# Patient Record
Sex: Male | Born: 1985 | Hispanic: No | Marital: Single | Smoking: Never smoker
Health system: Southern US, Community
[De-identification: ages and names within clinical notes are randomized; demographics above are authoritative.]

## PROBLEM LIST (undated history)

## (undated) DIAGNOSIS — I10 Essential (primary) hypertension: Secondary | ICD-10-CM

---

## 2016-11-05 ENCOUNTER — Emergency Department (HOSPITAL_COMMUNITY): Payer: 59

## 2016-11-05 ENCOUNTER — Encounter (HOSPITAL_COMMUNITY): Payer: Self-pay | Admitting: Emergency Medicine

## 2016-11-05 ENCOUNTER — Emergency Department (HOSPITAL_COMMUNITY)
Admission: EM | Admit: 2016-11-05 | Discharge: 2016-11-05 | Disposition: A | Discharge status: Home / Self Care | Payer: 59 | Attending: Emergency Medicine | Admitting: Emergency Medicine

## 2016-11-05 DIAGNOSIS — R51 Headache: Secondary | ICD-10-CM | POA: Diagnosis not present

## 2016-11-05 DIAGNOSIS — R0602 Shortness of breath: Secondary | ICD-10-CM | POA: Insufficient documentation

## 2016-11-05 DIAGNOSIS — J189 Pneumonia, unspecified organism: Secondary | ICD-10-CM | POA: Insufficient documentation

## 2016-11-05 DIAGNOSIS — I10 Essential (primary) hypertension: Secondary | ICD-10-CM | POA: Insufficient documentation

## 2016-11-05 HISTORY — DX: Essential (primary) hypertension: I10

## 2016-11-05 LAB — BASIC METABOLIC PANEL
ANION GAP: 10 (ref 5–15)
BUN: 11 mg/dL (ref 6–20)
CHLORIDE: 100 mmol/L — AB (ref 101–111)
CO2: 25 mmol/L (ref 22–32)
CREATININE: 1.03 mg/dL (ref 0.61–1.24)
Calcium: 9.4 mg/dL (ref 8.9–10.3)
GFR calc non Af Amer: >60 mL/min (ref >60)
Glucose, Bld: 98 mg/dL (ref 65–99)
POTASSIUM: 3.7 mmol/L (ref 3.5–5.1)
Sodium: 135 mmol/L (ref 135–145)

## 2016-11-05 LAB — CBC
HEMATOCRIT: 46 % (ref 39.0–52.0)
HEMOGLOBIN: 15.6 g/dL (ref 13.0–17.0)
MCH: 27.4 pg (ref 26.0–34.0)
MCHC: 33.9 g/dL (ref 30.0–36.0)
MCV: 80.7 fL (ref 78.0–100.0)
Platelets: 166 10*3/uL (ref 150–400)
RBC: 5.7 MIL/uL (ref 4.22–5.81)
RDW: 12.4 % (ref 11.5–15.5)
WBC: 8.8 10*3/uL (ref 4.0–10.5)

## 2016-11-05 LAB — URINALYSIS, ROUTINE W REFLEX MICROSCOPIC
Bilirubin Urine: NEGATIVE
Glucose, UA: NEGATIVE mg/dL
Hgb urine dipstick: NEGATIVE
Ketones, ur: NEGATIVE mg/dL
LEUKOCYTES UA: NEGATIVE
NITRITE: NEGATIVE
PROTEIN: NEGATIVE mg/dL
Specific Gravity, Urine: 1.011 (ref 1.005–1.030)
pH: 6.5 (ref 5.0–8.0)

## 2016-11-05 LAB — BRAIN NATRIURETIC PEPTIDE: B NATRIURETIC PEPTIDE 5: 10 pg/mL (ref 0.0–100.0)

## 2016-11-05 LAB — TROPONIN I: Troponin I: <0.03 ng/mL (ref <0.03)

## 2016-11-05 MED ORDER — DIPHENHYDRAMINE HCL 50 MG/ML IJ SOLN
25.0000 mg | Freq: Once | INTRAMUSCULAR | Status: AC
  Administered 2016-11-05: 25 mg via INTRAVENOUS
  Filled 2016-11-05: qty 1

## 2016-11-05 MED ORDER — KETOROLAC TROMETHAMINE 30 MG/ML IJ SOLN
15.0000 mg | Freq: Once | INTRAMUSCULAR | Status: AC
  Administered 2016-11-05: 15 mg via INTRAVENOUS
  Filled 2016-11-05: qty 1

## 2016-11-05 MED ORDER — METOCLOPRAMIDE HCL 5 MG/ML IJ SOLN
10.0000 mg | Freq: Once | INTRAMUSCULAR | Status: AC
Start: 2016-11-05 — End: 2016-11-05
  Administered 2016-11-05: 10 mg via INTRAVENOUS
  Filled 2016-11-05: qty 2

## 2016-11-05 MED ORDER — HYDROCHLOROTHIAZIDE 25 MG PO TABS: 25.0000 mg | ORAL_TABLET | Freq: Every day | ORAL | 0 refills | Status: AC

## 2016-11-05 MED ORDER — BENZONATATE 100 MG PO CAPS: 100.0000 mg | ORAL_CAPSULE | Freq: Three times a day (TID) | ORAL | 0 refills | Status: DC | PRN

## 2016-11-05 MED ORDER — DOXYCYCLINE HYCLATE 100 MG PO CAPS: 100.0000 mg | ORAL_CAPSULE | Freq: Two times a day (BID) | ORAL | 0 refills | Status: AC

## 2016-11-05 NOTE — ED Provider Notes (Signed)
[facility]EMER[facility]ENCY DEPT Provider Note   CSN: [id number] Arrival date & time: 11/05/16  1525     History   Chief Complaint Chief Complaint  Patient presents with  . Hypertension  . Generalized Body Aches    HPI Antonieta LovelessMatthew Prindiville is a 30 y.o. male.  The history is provided by the patient.    30 year old male with past medical history of hypertension who presents with generalized body aches and hypertension. The patient states that over the last week, he has had progressively worsening cough, general fatigue, and diffuse myalgias. He has been taking over-the-counter cough and cold medicine for this. He has a history of hypertension as well. You presented to med clinic today for evaluation and was noted to be hypertensive to the 170s over 110s. He was subsequent sent for evaluation. Currently, the patient endorses generalized body aches but denies fevers. He has had a mild, nonproductive cough. No shortness of breath. Denies any current vision changes, headache, chest pain, or other complaints. He does note a headache at the time that he was at the clinic. Denies any vision changes.  Past Medical History:  Diagnosis Date  . Hypertension     There are no active problems to display for this patient.   History reviewed. No pertinent surgical history.     Home Medications    Prior to Admission medications   Medication Sig Start Date End Date Taking? Authorizing Provider  benzonatate (TESSALON PERLES) 100 MG capsule Take 1 capsule (100 mg total) by mouth 3 (three) times daily as needed for cough. 11/05/16   Shaune Pollackameron Joshalyn Ancheta, MD  doxycycline (VIBRAMYCIN) 100 MG capsule Take 1 capsule (100 mg total) by mouth 2 (two) times daily. 11/05/16 11/12/16  Shaune Pollackameron Adlene Adduci, MD  hydrochlorothiazide (HYDRODIURIL) 25 MG tablet Take 1 tablet (25 mg total) by mouth daily. 11/05/16   Shaune Pollackameron Gabryelle Whitmoyer, MD    Family History No family history on file.  Social History Social History  Substance Use Topics  .  Smoking status: Never Smoker  . Smokeless tobacco: Never Used  . Alcohol use Yes     Comment: 1-2 drinks per week     Allergies   Patient has no known allergies.   Review of Systems Review of Systems  Constitutional: Positive for fatigue. Negative for chills and fever.  HENT: Negative for congestion and rhinorrhea.   Eyes: Negative for visual disturbance.  Respiratory: Negative for cough, shortness of breath and wheezing.   Cardiovascular: Negative for chest pain and leg swelling.  Gastrointestinal: Negative for abdominal pain, diarrhea, nausea and vomiting.  Genitourinary: Negative for dysuria and flank pain.  Musculoskeletal: Positive for arthralgias and myalgias. Negative for neck pain and neck stiffness.  Skin: Negative for rash and wound.  Allergic/Immunologic: Negative for immunocompromised state.  Neurological: Positive for headaches. Negative for syncope and weakness.  All other systems reviewed and are negative.    Physical Exam Updated Vital Signs BP 145/97   Pulse 77   Temp 98.8 F (37.1 C) (Oral)   Resp 13   Ht 5\' 10"  (1.778 m)   Wt 170 lb (77.1 kg)   SpO2 99%   BMI 24.39 kg/m   Physical Exam  Constitutional: He is oriented to person, place, and time. He appears well-developed and well-nourished. No distress.  HENT:  Head: Normocephalic and atraumatic.  Mouth/Throat: Oropharynx is clear and moist. No oropharyngeal exudate.  Eyes: Conjunctivae are normal.  Neck: Normal range of motion. Neck supple.  Cardiovascular: Normal rate, regular rhythm and normal  heart sounds.  Exam reveals no friction rub.   No murmur heard. Pulmonary/Chest: Effort normal and breath sounds normal. No respiratory distress. He has no wheezes. He has no rales.  Abdominal: Soft. Bowel sounds are normal. He exhibits no distension. There is no tenderness. There is no guarding.  Musculoskeletal: He exhibits no edema.  Neurological: He is alert and oriented to person, place, and time.  He exhibits normal muscle tone.  Skin: Skin is warm. Capillary refill takes less than 2 seconds.  Psychiatric: He has a normal mood and affect.  Nursing note and vitals reviewed.    ED Treatments / Results  Labs (all labs ordered are listed, but only abnormal results are displayed) Labs Reviewed  BASIC METABOLIC PANEL - Abnormal; Notable for the following:       Result Value   Chloride 100 (*)    All other components within normal limits  CBC  TROPONIN I  BRAIN NATRIURETIC PEPTIDE  URINALYSIS, ROUTINE W REFLEX MICROSCOPIC (NOT AT [facility])    EKG  EKG Interpretation  Date/Time:  Friday November 05 2016 16:07:37 EST Ventricular Rate:  83 PR Interval:    QRS Duration: 95 QT Interval:  342 QTC Calculation: 402 R Axis:   85 Text Interpretation:  Sinus rhythm Probable left atrial enlargement Baseline wander in lead(s) I III aVL No old tracing to compare Confirmed by Margaretha Mahan MD, Patrick Salemi [phone]) on 11/06/2016 12:51:38 AM       Radiology Dg Chest 2 View  Result Date: 11/05/2016 CLINICAL DATA:  Hypertension, evaluate for possible congestive heart failure. EXAM: CHEST  2 VIEW COMPARISON:  None in PACs FINDINGS: The lungs are mildly hyperinflated. There is no focal infiltrate. The interstitial markings are coarse especially in the left upper lobe. There is mild fullness of the left hilar region. There is no pleural effusion or pneumothorax. The heart and pulmonary vascularity are normal. The bony thorax is unremarkable. IMPRESSION: Hyperinflation consistent with reactive airway disease. Increased interstitial density in the left upper lobe and prominence of the left hilar regions may reflect interstitial pneumonia and possible reactive lymphadenopathy. A follow-up chest x-ray in 2-3 weights following anticipated antibiotic therapy is recommended to assure clearing and exclude underlying malignancy. No CHF Electronically Signed   By: David  [place] M.D.   On: 11/05/2016 16:23   Ct Head Wo  Contrast  Result Date: 11/05/2016 CLINICAL DATA:  Posterior headache, hypertension EXAM: CT HEAD WITHOUT CONTRAST TECHNIQUE: Contiguous axial images were obtained from the base of the skull through the vertex without intravenous contrast. COMPARISON:  None. FINDINGS: Brain: No evidence of acute infarction, hemorrhage, hydrocephalus, extra-axial collection or mass lesion/mass effect. Vascular: No hyperdense vessel or unexpected calcification. Skull: Normal. Negative for fracture or focal lesion. Sinuses/Orbits: Moderate mucosal thickening in the ethmoid sinuses. Orbits appear intact. Other: None IMPRESSION: No CT evidence for acute intracranial abnormality. Electronically Signed   By: Jasmine Pang M.D.   On: 11/05/2016 16:38    Procedures Procedures (including critical care time)  Medications Ordered in ED Medications  metoCLOPramide (REGLAN) injection 10 mg (10 mg Intravenous Given 11/05/16 1729)  diphenhydrAMINE (BENADRYL) injection 25 mg (25 mg Intravenous Given 11/05/16 1729)  ketorolac (TORADOL) 30 MG/ML injection 15 mg (15 mg Intravenous Given 11/05/16 1729)     Initial Impression / Assessment and Plan / ED Course  I have reviewed the triage vital signs and the nursing notes.  Pertinent labs & imaging results that were available during my care of the patient were reviewed by me  and considered in my medical decision making (see chart for details).  Clinical Course     30 year old male with past medical history of hypertension who presents with hypertension, general fatigue, arthralgias, and transient headache. Regarding his cough and myalgias, suspect viral URI versus community acquired pneumonia. Chest x-ray shows interstitial prominence consistent with atypical pneumonia. He has no hypoxia, fever, or evidence of sepsis. Will treat with doxycycline. Regarding his hypertension, he was notably hypertensive at [facility]. He has a history of hypertension and states he has been taking his  medications. No signs of hypertensive emergency. Given his headache and marked hypertension, CT head obtained and is negative. Screening lab work is unremarkable. No evidence of renal failure or CHF. Will increase his hydrochlorothiazide from 12.5-25 daily and discharge with outpatient follow-up.  Final Clinical Impressions(s) / ED Diagnoses   Final diagnoses:  Essential hypertension  Community acquired pneumonia, unspecified laterality    New Prescriptions Discharge Medication List as of 11/05/2016  6:15 PM    START taking these medications   Details  benzonatate (TESSALON PERLES) 100 MG capsule Take 1 capsule (100 mg total) by mouth 3 (three) times daily as needed for cough., Starting Fri 11/05/2016, Print    doxycycline (VIBRAMYCIN) 100 MG capsule Take 1 capsule (100 mg total) by mouth 2 (two) times daily., Starting Fri 11/05/2016, Until Fri 11/12/2016, Print    hydrochlorothiazide (HYDRODIURIL) 25 MG tablet Take 1 tablet (25 mg total) by mouth daily., Starting Fri 11/05/2016, Print         Shaune Pollackameron Romy Ipock, MD 11/06/16 [phone]

## 2016-11-05 NOTE — ED Notes (Signed)
Patient transported to X-ray 

## 2016-11-05 NOTE — ED Triage Notes (Signed)
Pt arrives via [facility] from [facility], pt reports he went there for flu like symptoms and posterior headache since yesterday, pt has hx of htn and was started on hctz 10/20, pt was found to have elevated bp at [facility] causing NP to call [facility]. bp 200/110 with [facility]. Pt a/ox4, resp e/u, nad.

## 2016-11-05 NOTE — Discharge Instructions (Signed)
Take Corcidin HBP (high blood pressure) as it can help with your symptoms and not make your blood pressure worse

## 2017-04-29 ENCOUNTER — Encounter (HOSPITAL_COMMUNITY): Payer: Self-pay | Admitting: Emergency Medicine

## 2017-04-29 ENCOUNTER — Ambulatory Visit (HOSPITAL_COMMUNITY)
Admission: EM | Admit: 2017-04-29 | Discharge: 2017-04-29 | Disposition: A | Discharge status: Home / Self Care | Payer: 59 | Attending: Family Medicine | Admitting: Family Medicine

## 2017-04-29 DIAGNOSIS — A09 Infectious gastroenteritis and colitis, unspecified: Secondary | ICD-10-CM

## 2017-04-29 DIAGNOSIS — R197 Diarrhea, unspecified: Secondary | ICD-10-CM

## 2017-04-29 MED ORDER — CIPROFLOXACIN HCL 500 MG PO TABS: 500.0000 mg | ORAL_TABLET | Freq: Two times a day (BID) | ORAL | 0 refills | Status: AC

## 2017-04-29 NOTE — ED Provider Notes (Signed)
CSN: [id number]     Arrival date & time 04/29/17  1001 History   None    Chief Complaint  Patient presents with  . Abdominal Pain  . Diarrhea   (Consider location/radiation/quality/duration/timing/severity/associated sxs/prior Treatment) Patient c/o diarrhea and abdominal cramps.  Patient recently traveled to [place].   The history is provided by the patient.  Abdominal Pain  Pain location:  Generalized Pain quality: aching   Pain radiates to:  Does not radiate Pain severity:  Mild Onset quality:  Sudden Duration:  1 week Timing:  Constant Progression:  Worsening Chronicity:  New Relieved by:  Nothing Worsened by:  Nothing Ineffective treatments:  None tried Associated symptoms: diarrhea   Diarrhea  Associated symptoms: abdominal pain     Past Medical History:  Diagnosis Date  . Hypertension    History reviewed. No pertinent surgical history. History reviewed. No pertinent family history. Social History  Substance Use Topics  . Smoking status: Never Smoker  . Smokeless tobacco: Never Used  . Alcohol use Yes     Comment: 1-2 drinks per week    Review of Systems  Constitutional: Negative.   Eyes: Negative.   Respiratory: Negative.   Gastrointestinal: Positive for abdominal pain and diarrhea.  Endocrine: Negative.   Genitourinary: Negative.   Musculoskeletal: Negative.   Allergic/Immunologic: Negative.   Neurological: Negative.   Hematological: Negative.   Psychiatric/Behavioral: Negative.     Allergies  Patient has no known allergies.  Home Medications   Prior to Admission medications   Medication Sig Start Date End Date Taking? Authorizing Provider  hydrochlorothiazide (HYDRODIURIL) 25 MG tablet Take 1 tablet (25 mg total) by mouth daily. 11/05/16  Yes Shaune Pollackameron Isaacs, MD  ciprofloxacin (CIPRO) 500 MG tablet Take 1 tablet (500 mg total) by mouth 2 (two) times daily. 04/29/17   Deatra CanterWilliam J Tahisha Hakim, FNP   Meds Ordered and Administered this Visit   Medications - No data to display  BP (!) 154/110 (BP Location: Right Arm)   Pulse 94   Temp 99 F (37.2 C) (Oral)   Resp 18   SpO2 98%  No data found.   Physical Exam  Constitutional: He is oriented to person, place, and time. He appears well-developed and well-nourished.  HENT:  Head: Normocephalic and atraumatic.  Right Ear: External ear normal.  Left Ear: External ear normal.  Mouth/Throat: Oropharynx is clear and moist.  Eyes: Conjunctivae and EOM are normal. Pupils are equal, round, and reactive to light.  Neck: Normal range of motion. Neck supple.  Cardiovascular: Normal rate and regular rhythm.   Pulmonary/Chest: Effort normal and breath sounds normal.  Neurological: He is alert and oriented to person, place, and time.  Nursing note and vitals reviewed.   Urgent Care Course     Procedures (including critical care time)  Labs Review Labs Reviewed - No data to display  Imaging Review No results found.   Visual Acuity Review  Right Eye Distance:   Left Eye Distance:   Bilateral Distance:    Right Eye Near:   Left Eye Near:    Bilateral Near:         MDM   1. Traveler's diarrhea   2. Diarrhea, unspecified type    cipro 500mg  one po bid x 5 days #10      Deatra CanterWilliam J Vladislav Axelson, FNP 04/29/17 1101

## 2017-04-29 NOTE — ED Triage Notes (Signed)
[facility] a complaint of abdominal pain and diarrhea x 1 week that started when he returned from a trip from [place]. The patient reported eating raw fish while on his trip.

## 2018-04-21 IMAGING — CT CT HEAD W/O CM
4 series · 16 of 47 positions shown, 18 images · non-contrast
Comparison: None.

CLINICAL DATA: Posterior headache, hypertension

EXAM:
CT HEAD WITHOUT CONTRAST
TECHNIQUE: Contiguous axial images were obtained from the base of the skull
through the vertex without intravenous contrast.

[Series 2: head without · axial · non-contrast · 0.41mm/px · z∈[-85,+35]mm · 7 of 32 slices shown, 9 images]
[im 4/32  brain]
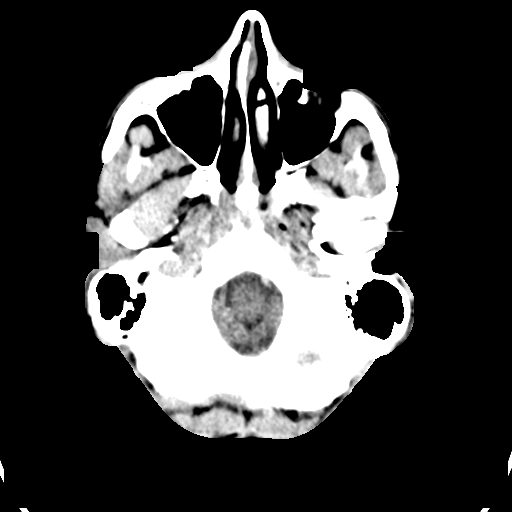
[im 4/32  bone]
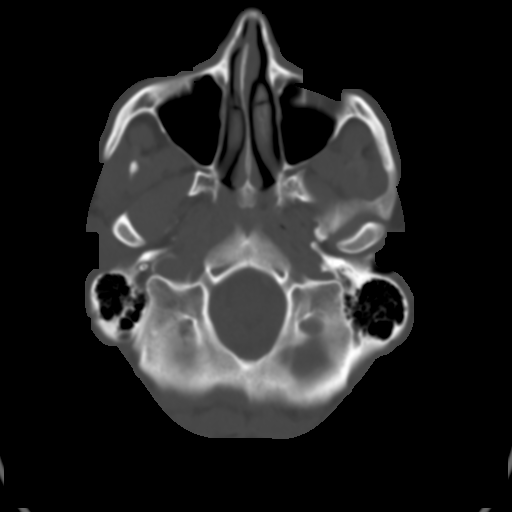
[im 8/32  brain]
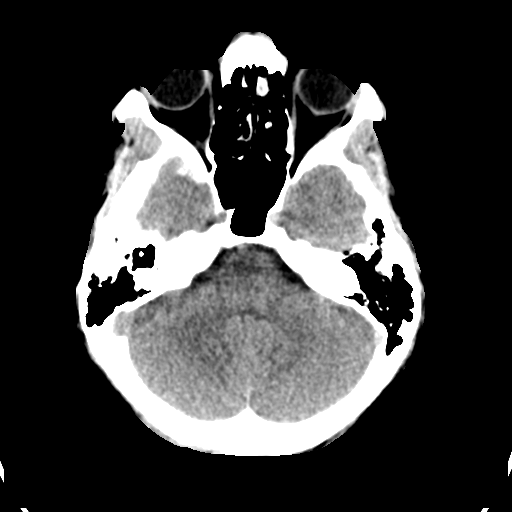
[im 12/32  brain]
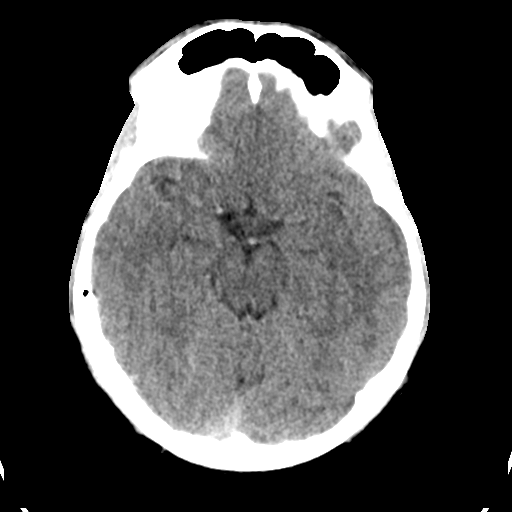
[im 16/32  brain]
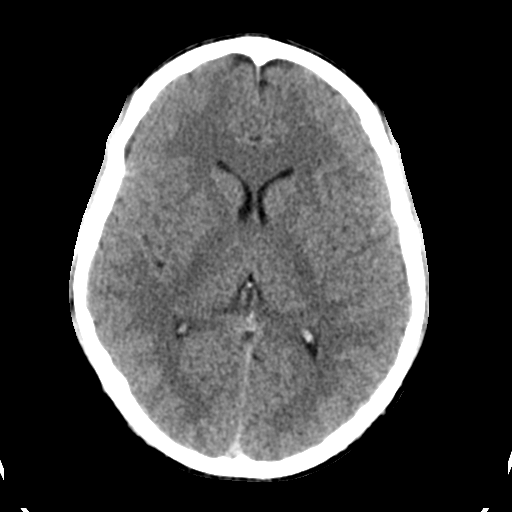
[im 20/32  brain]
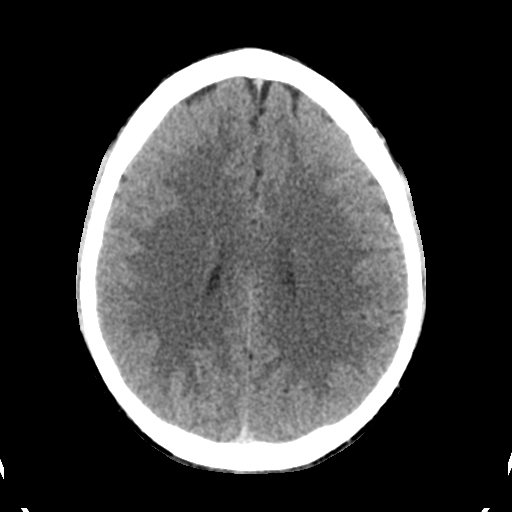
[im 20/32  bone]
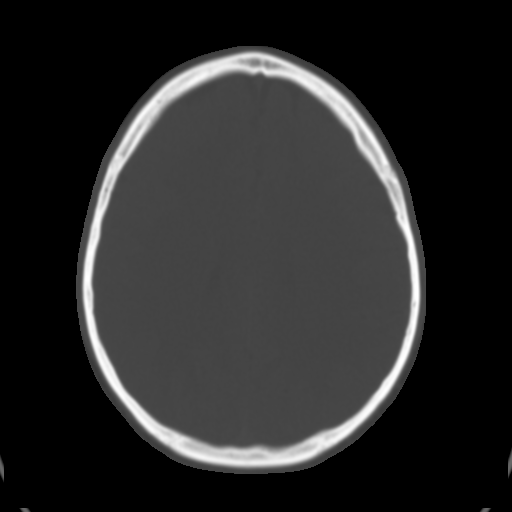
[im 24/32  brain]
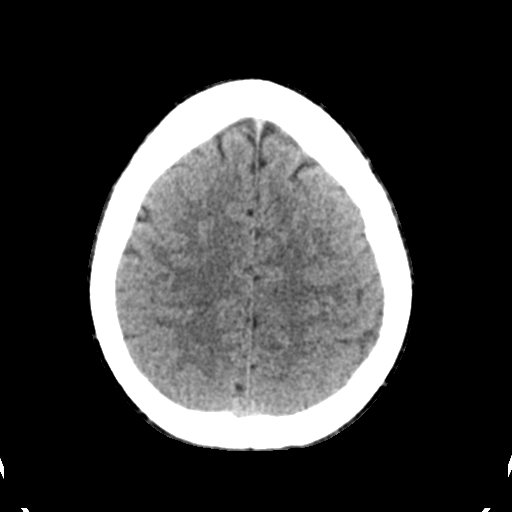
[im 28/32  brain]
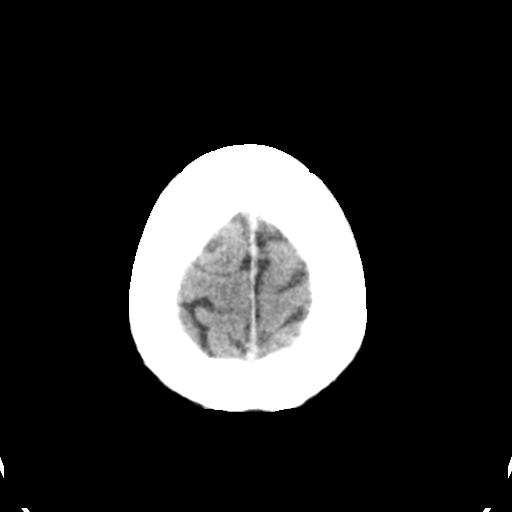

[Series 3: head bone · axial · 0.41mm/px · z∈[-86,-54]mm · 3 of 80 slices shown]
[im 8/80  bone]
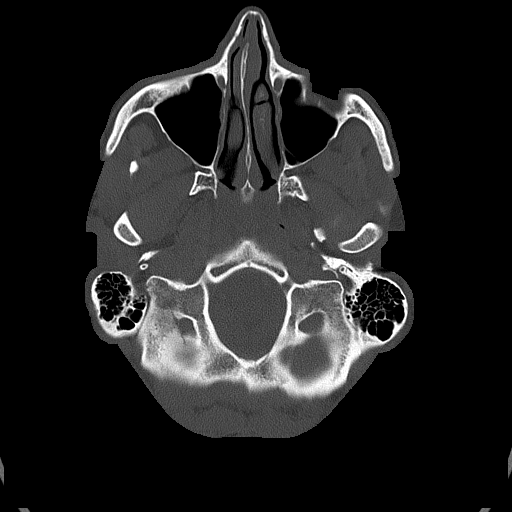
[im 16/80  bone]
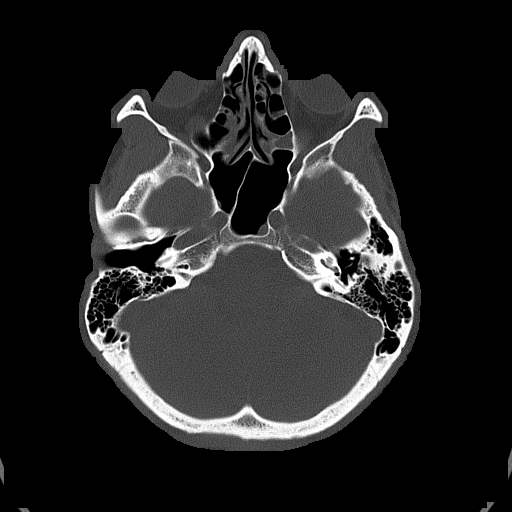
[im 24/80  bone]
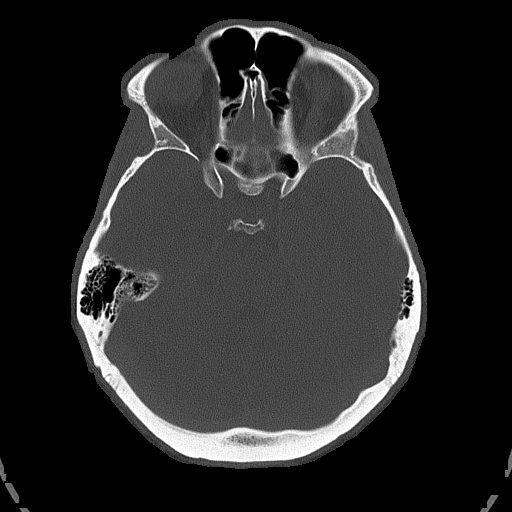

[Series 4: head without cor · coronal · non-contrast · 0.31mm/px · 3 of 70 slices shown]
[im 24/70  brain]
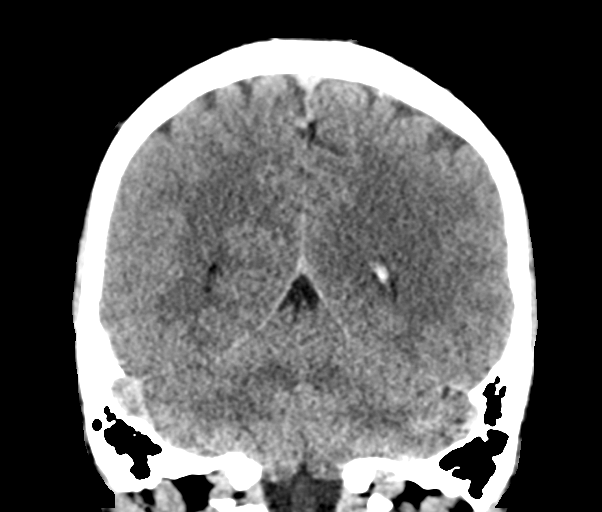
[im 31/70  brain]
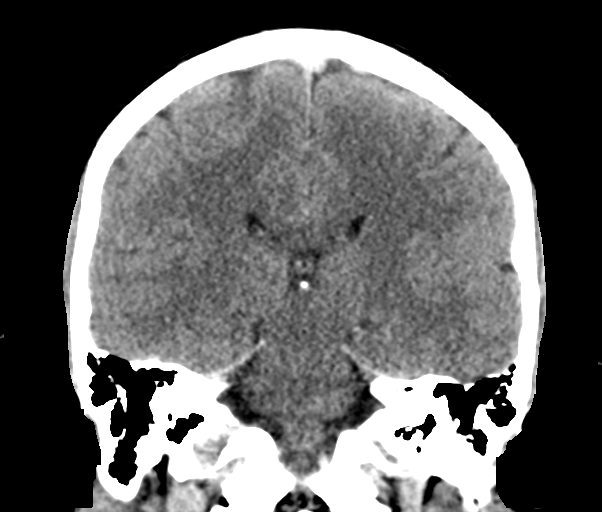
[im 39/70  brain]
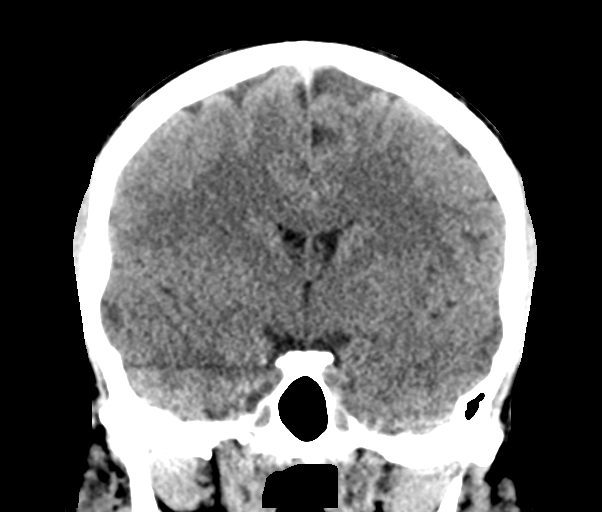

[Series 5: head without sag · sagittal · non-contrast · 0.31mm/px · 3 of 60 slices shown]
[im 20/60  brain]
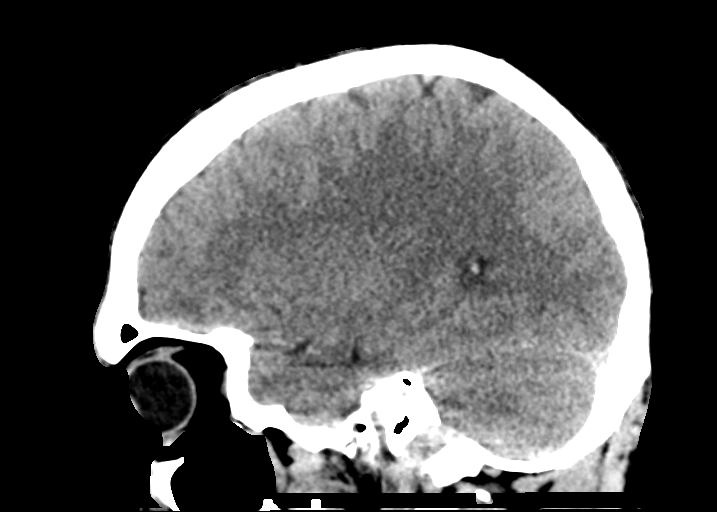
[im 30/60  brain]
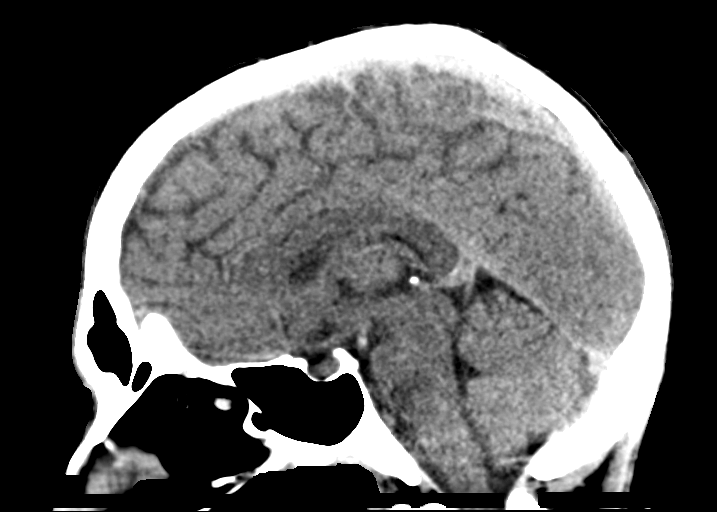
[im 40/60  brain]
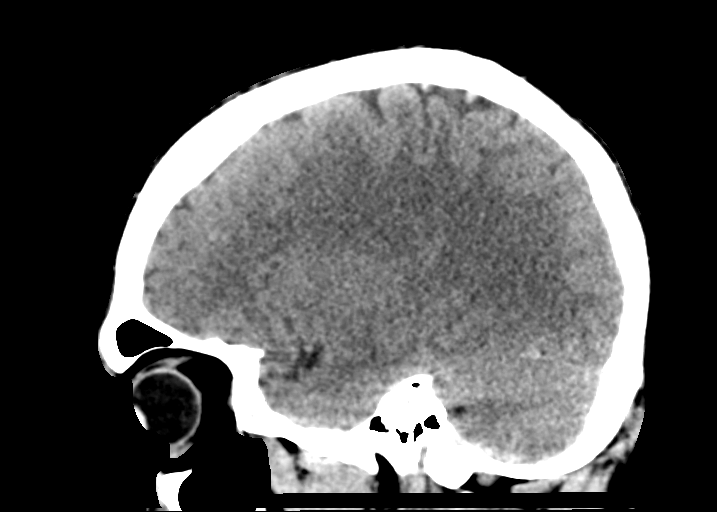

[16 of 47 positions shown; findings below may reference images not displayed]

FINDINGS: Brain: No evidence of acute infarction, hemorrhage, hydrocephalus,
extra-axial collection or mass lesion/mass effect.

Vascular: No hyperdense vessel or unexpected calcification.

Skull: Normal. Negative for fracture or focal lesion.

Sinuses/Orbits: Moderate mucosal thickening in the ethmoid sinuses.
Orbits appear intact.

Other: None
IMPRESSION: No CT evidence for acute intracranial abnormality.
